# Patient Record
Sex: Female | Born: 1985 | Race: Black or African American | Hispanic: No | Marital: Married | Smoking: Current every day smoker
Health system: Southern US, Community
[De-identification: ages and names within clinical notes are randomized; demographics above are authoritative.]

## PROBLEM LIST (undated history)

## (undated) HISTORY — PX: ESOPHAGEAL DILATION: SHX303

## (undated) HISTORY — PX: OTHER SURGICAL HISTORY: SHX169

## (undated) HISTORY — PX: TONSILLECTOMY: SUR1361

## (undated) HISTORY — PX: CHOLECYSTECTOMY: SHX55

---

## 2004-06-30 ENCOUNTER — Emergency Department: Payer: Self-pay | Admitting: Emergency Medicine

## 2005-02-08 ENCOUNTER — Emergency Department: Payer: Self-pay | Admitting: Emergency Medicine

## 2006-01-20 ENCOUNTER — Emergency Department: Payer: Self-pay | Admitting: Unknown Physician Specialty

## 2006-02-15 ENCOUNTER — Emergency Department: Payer: Self-pay

## 2006-02-15 ENCOUNTER — Other Ambulatory Visit: Payer: Self-pay

## 2006-02-19 ENCOUNTER — Emergency Department: Payer: Self-pay | Admitting: Emergency Medicine

## 2006-03-10 ENCOUNTER — Other Ambulatory Visit: Payer: Self-pay

## 2006-03-10 ENCOUNTER — Emergency Department: Payer: Self-pay | Admitting: Emergency Medicine

## 2006-03-23 ENCOUNTER — Emergency Department: Payer: Self-pay | Admitting: Emergency Medicine

## 2006-07-30 ENCOUNTER — Emergency Department: Payer: Self-pay | Admitting: Emergency Medicine

## 2006-08-03 ENCOUNTER — Emergency Department: Payer: Self-pay | Admitting: Unknown Physician Specialty

## 2006-09-02 ENCOUNTER — Ambulatory Visit: Payer: Self-pay

## 2006-09-20 ENCOUNTER — Emergency Department: Payer: Self-pay | Admitting: Emergency Medicine

## 2006-10-03 ENCOUNTER — Emergency Department: Payer: Self-pay | Admitting: Emergency Medicine

## 2007-05-04 ENCOUNTER — Ambulatory Visit: Payer: Self-pay

## 2007-05-05 ENCOUNTER — Inpatient Hospital Stay: Payer: Self-pay

## 2007-11-08 ENCOUNTER — Emergency Department: Payer: Self-pay | Admitting: Emergency Medicine

## 2007-11-09 ENCOUNTER — Emergency Department: Payer: Self-pay | Admitting: Emergency Medicine

## 2009-01-08 IMAGING — CR NECK SOFT TISSUES - 1+ VIEW
1 series · 3 of 3 positions shown · non-contrast
Comparison: none

REASON FOR EXAM: POSSIBLE FISHBONE CAUGHT IN THROAT
COMMENTS:

PROCEDURE:     DXR - DXR SOFT TISSUE NECK  - November 08, 2007 [DATE]
RESULT:     Three views of the cervical airway, performed at soft tissue
technique, reveal no evidence of retained foreign body. The air column
appears normal and the prevertebral soft tissue spaces also appear normal.

[Series 1: view not recorded · 0.17mm/px · 3 of 3 slices shown]
[im 1/3]
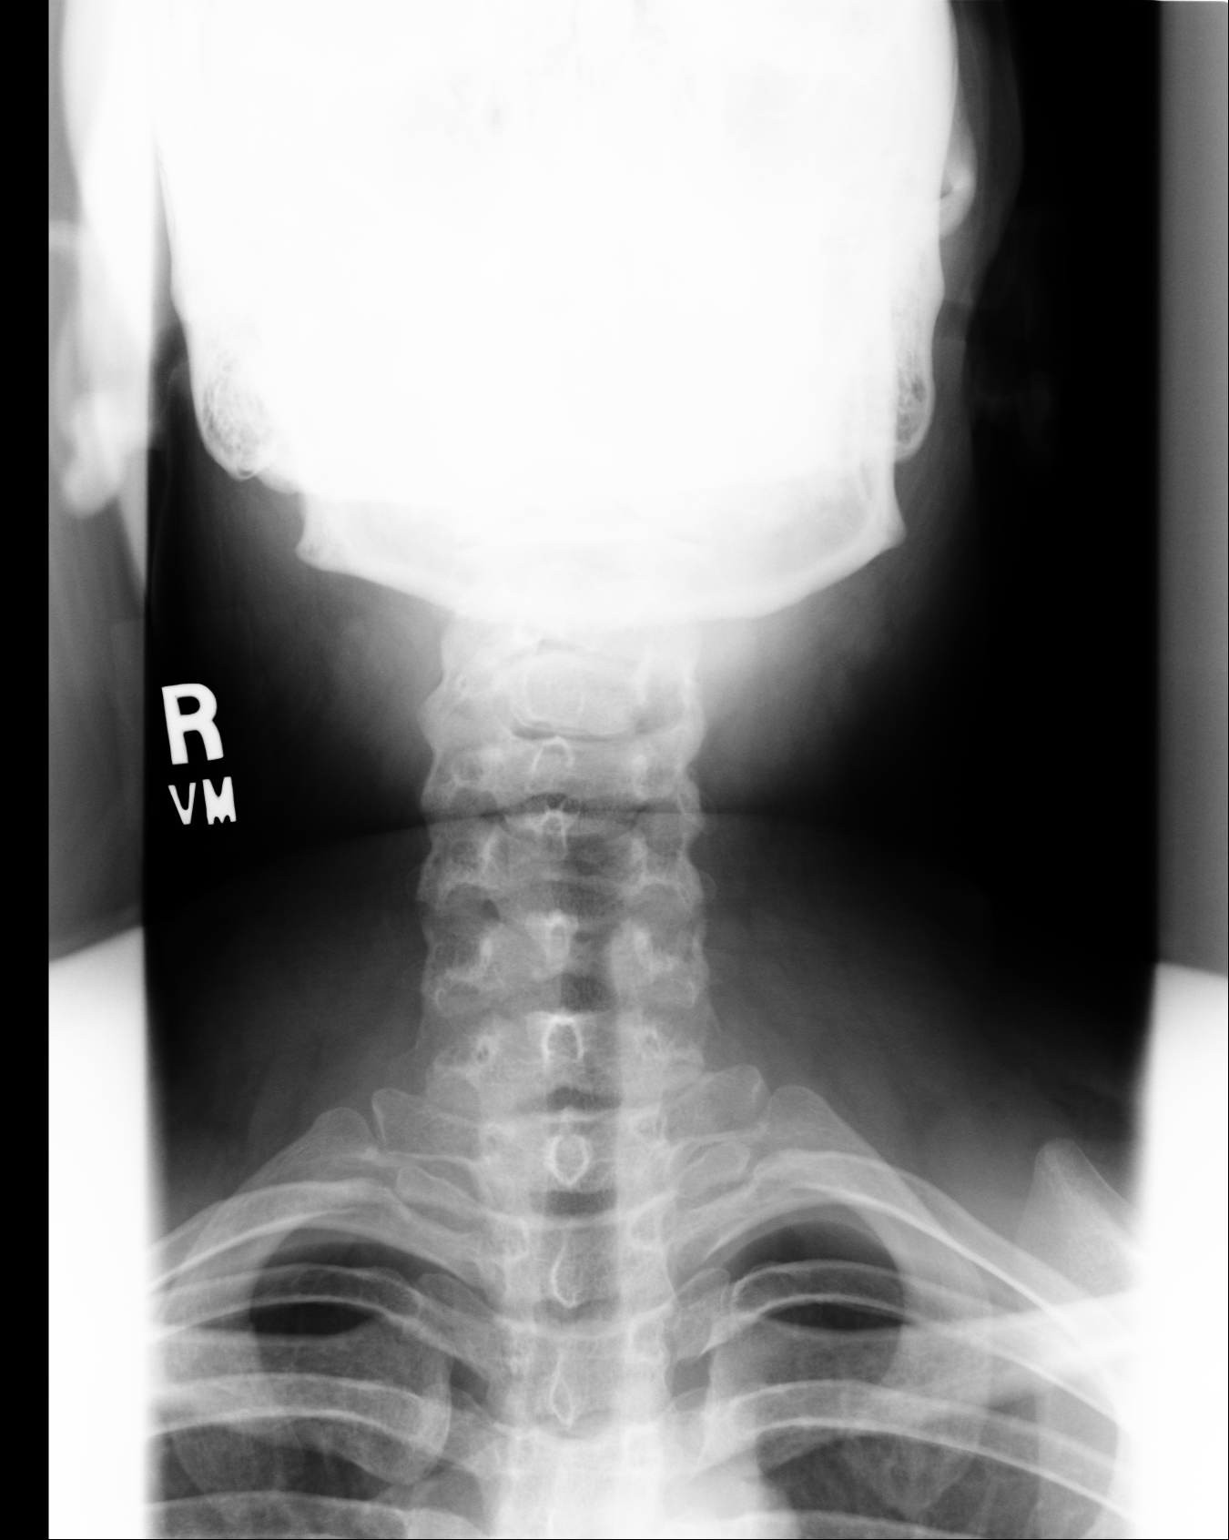
[im 2/3]
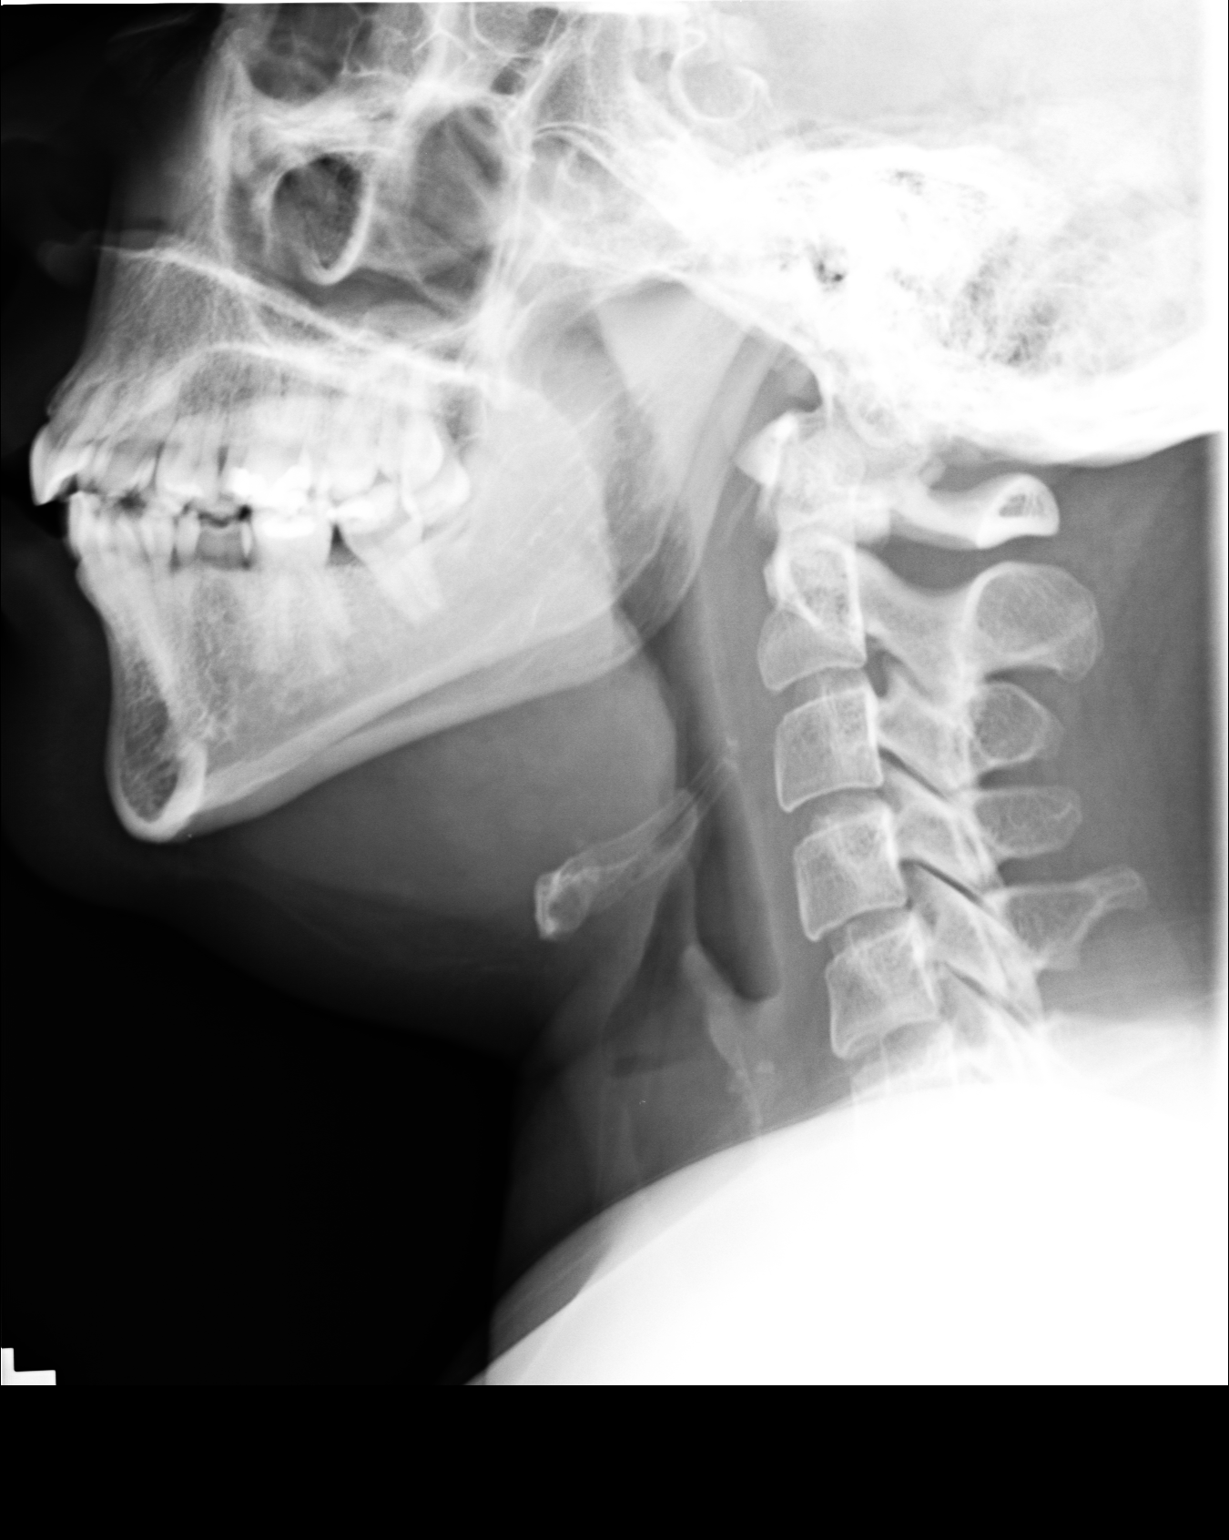
[im 3/3]
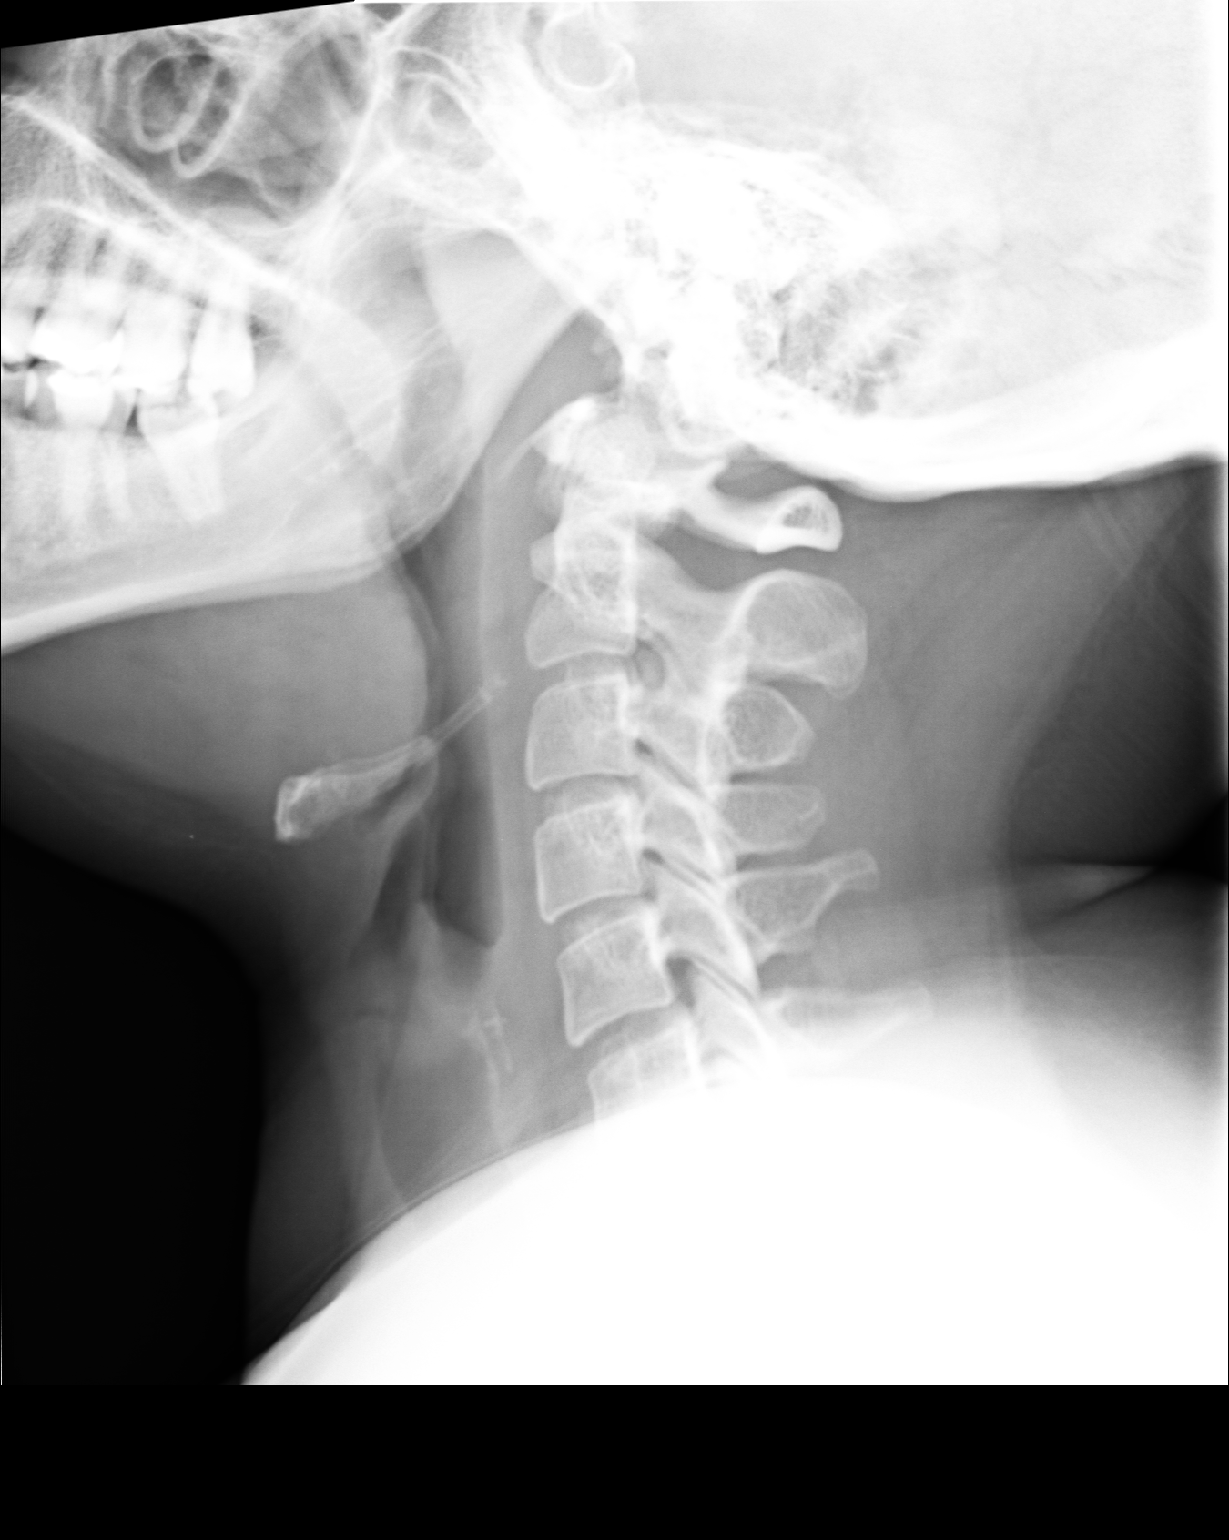

[3 of 3 positions shown; findings below may reference images not displayed]

IMPRESSION: I see no evidence of retained foreign body within the soft
tissues of the cervical airway.

## 2009-06-17 ENCOUNTER — Emergency Department: Payer: Self-pay

## 2011-12-22 ENCOUNTER — Emergency Department: Payer: Self-pay | Admitting: Emergency Medicine

## 2011-12-23 LAB — URINALYSIS, COMPLETE
Bilirubin,UR: NEGATIVE
Blood: NEGATIVE
Glucose,UR: NEGATIVE mg/dL (ref 0–75)
Leukocyte Esterase: NEGATIVE
Ph: 6 (ref 4.5–8.0)
Specific Gravity: 1.031 (ref 1.003–1.030)
WBC UR: 2 /HPF (ref 0–5)

## 2011-12-23 LAB — PREGNANCY, URINE: Pregnancy Test, Urine: NEGATIVE m[IU]/mL

## 2014-02-05 ENCOUNTER — Emergency Department: Payer: Self-pay | Admitting: Emergency Medicine

## 2014-02-05 LAB — BASIC METABOLIC PANEL
Anion Gap: 8 (ref 7–16)
BUN: 10 mg/dL (ref 7–18)
CO2: 25 mmol/L (ref 21–32)
Calcium, Total: 8.8 mg/dL (ref 8.5–10.1)
Chloride: 107 mmol/L (ref 98–107)
Creatinine: 0.96 mg/dL (ref 0.60–1.30)
EGFR (African American): >60
EGFR (Non-African Amer.): >60
GLUCOSE: 105 mg/dL — AB (ref 65–99)
Osmolality: 279 (ref 275–301)
Potassium: 3.1 mmol/L — ABNORMAL LOW (ref 3.5–5.1)
SODIUM: 140 mmol/L (ref 136–145)

## 2014-02-05 LAB — CBC
HCT: 38.7 % (ref 35.0–47.0)
HGB: 12 g/dL (ref 12.0–16.0)
MCH: 26.8 pg (ref 26.0–34.0)
MCHC: 31 g/dL — ABNORMAL LOW (ref 32.0–36.0)
MCV: 86 fL (ref 80–100)
Platelet: 385 10*3/uL (ref 150–440)
RBC: 4.48 10*6/uL (ref 3.80–5.20)
RDW: 14.5 % (ref 11.5–14.5)
WBC: 8.8 10*3/uL (ref 3.6–11.0)

## 2015-04-08 IMAGING — CT CT HEAD WITHOUT CONTRAST
1 series · 16 of 30 positions shown, 20 images · non-contrast
Comparison: None.

CLINICAL DATA: pt to ed with c/o right fifth digit numbness and
right foot numbness that started about 45 min pta, states right foot
feels better now however right hand fifth digit numbness remains.

EXAM:
CT HEAD WITHOUT CONTRAST
TECHNIQUE: Contiguous axial images were obtained from the base of the skull
through the vertex without intravenous contrast.

[Series 2: soft tissue · axial · 0.41mm/px · z∈[+1322,+1456]mm · 16 of 30 slices shown, 20 images]
[im 2/30  brain]
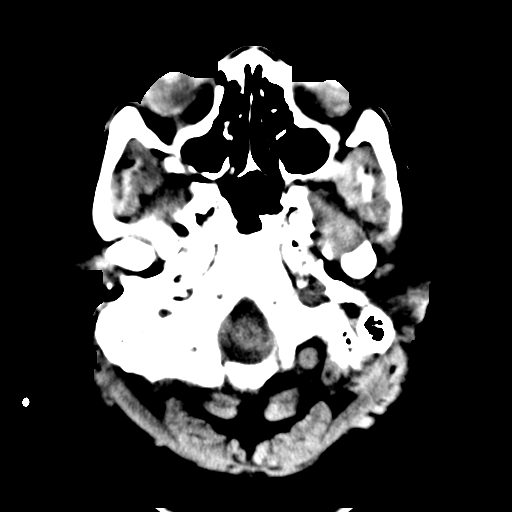
[im 2/30  bone]
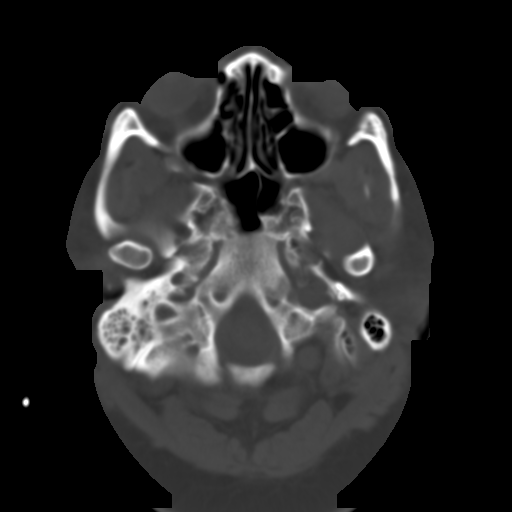
[im 4/30  brain]
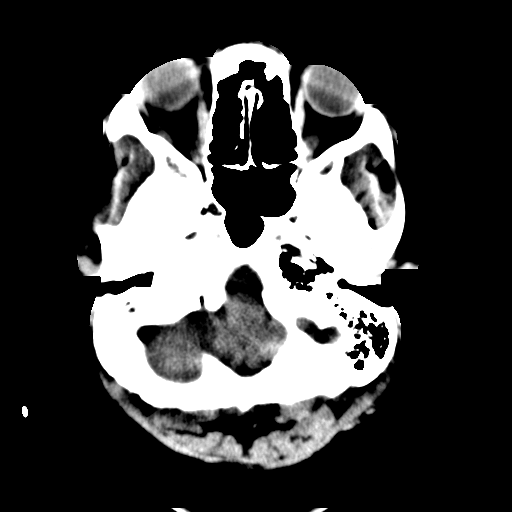
[im 6/30  brain]
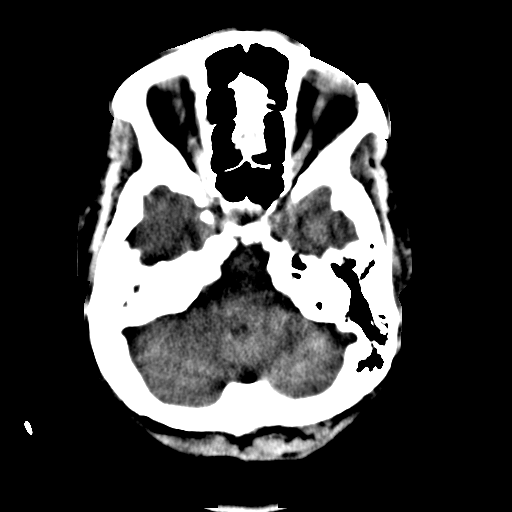
[im 8/30  brain]
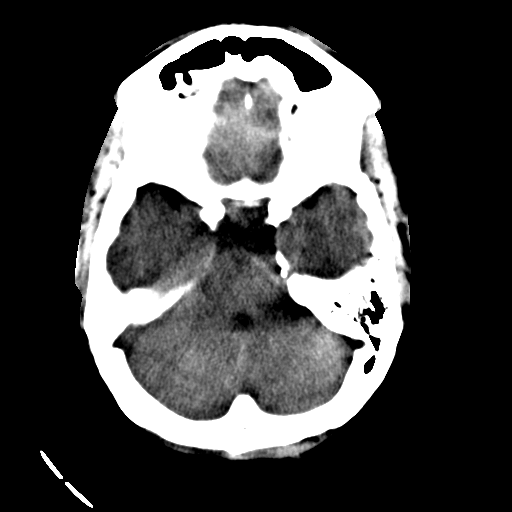
[im 9/30  brain]
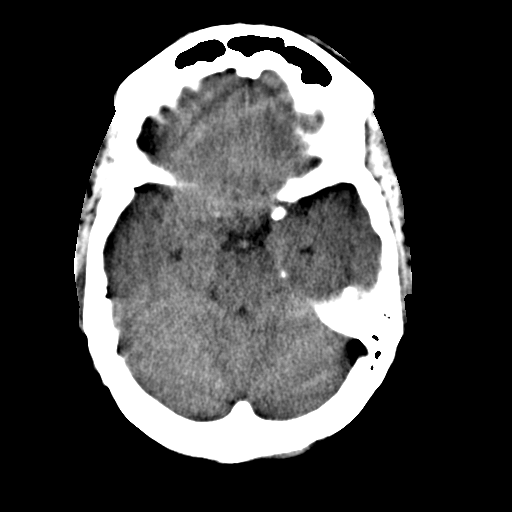
[im 9/30  bone]
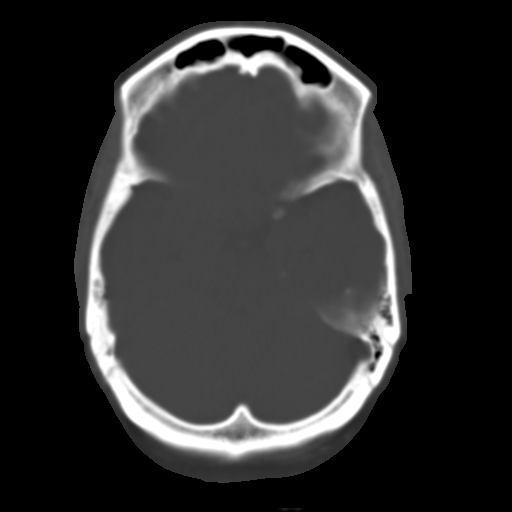
[im 11/30  brain]
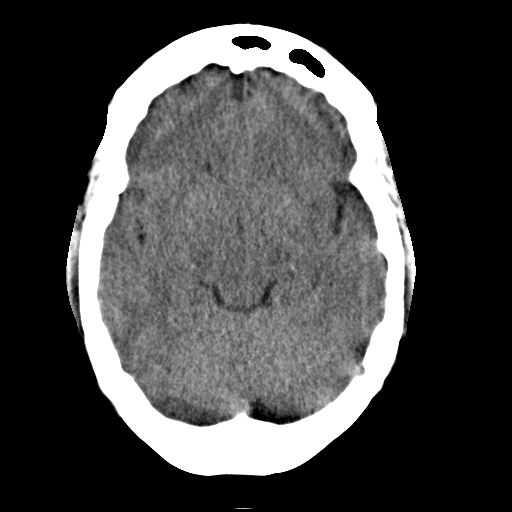
[im 13/30  brain]
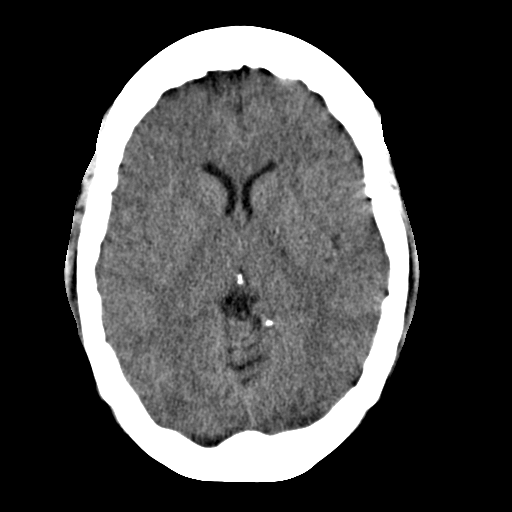
[im 15/30  brain]
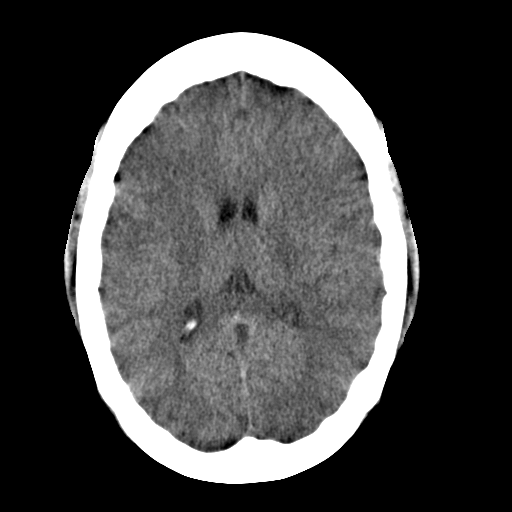
[im 16/30  brain]
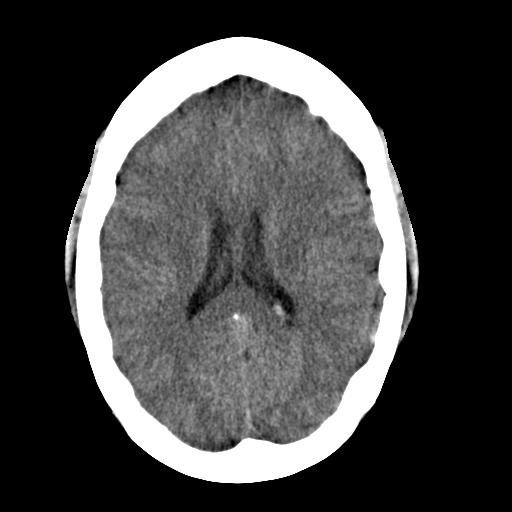
[im 16/30  bone]
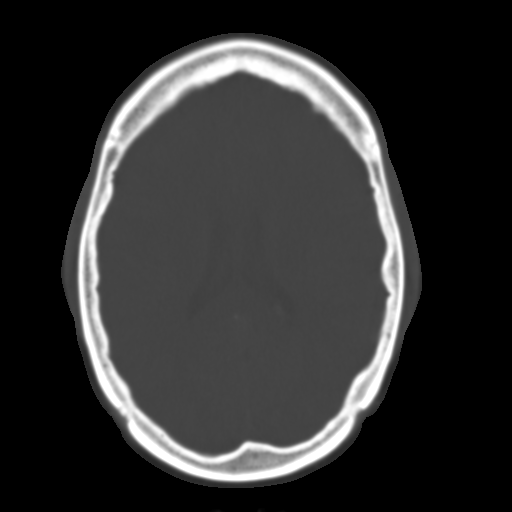
[im 18/30  brain]
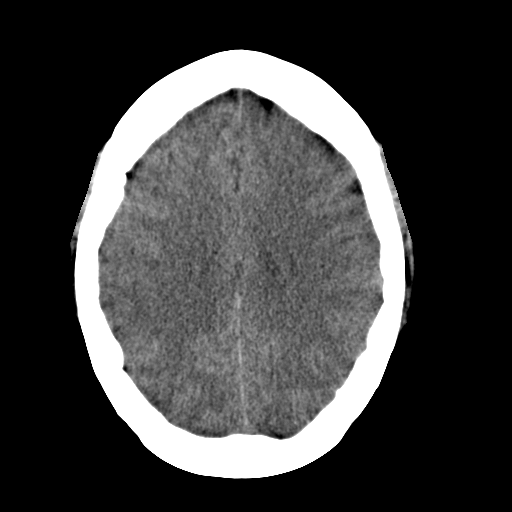
[im 20/30  brain]
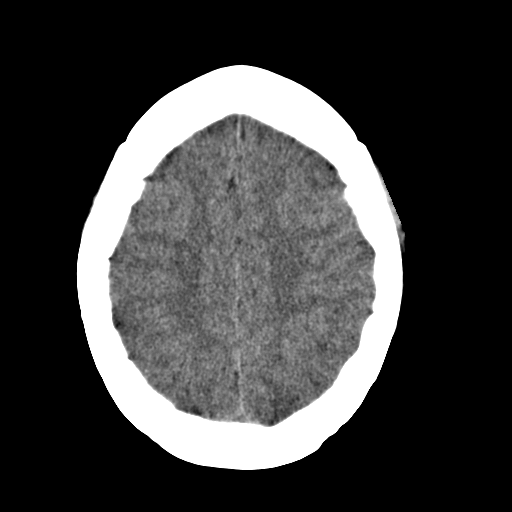
[im 22/30  brain]
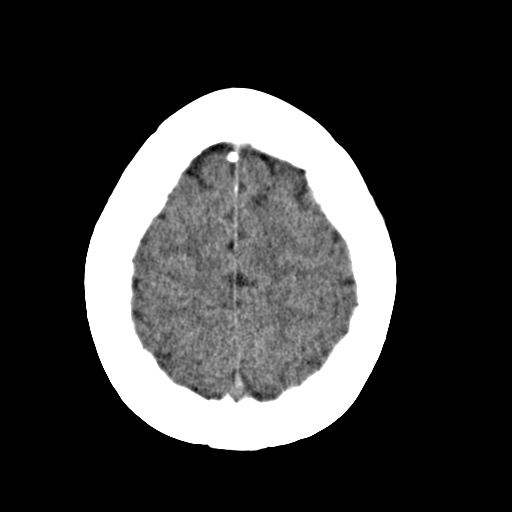
[im 23/30  brain]
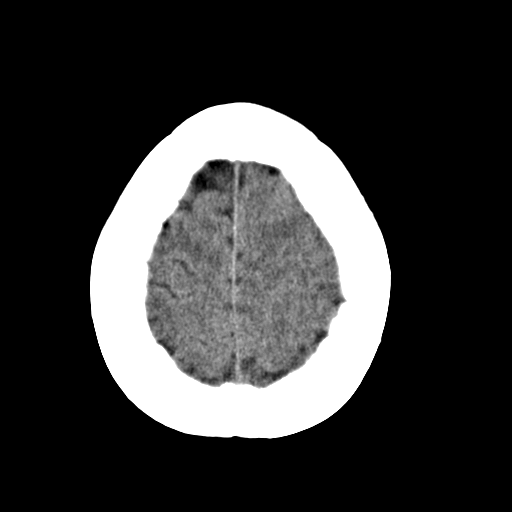
[im 23/30  bone]
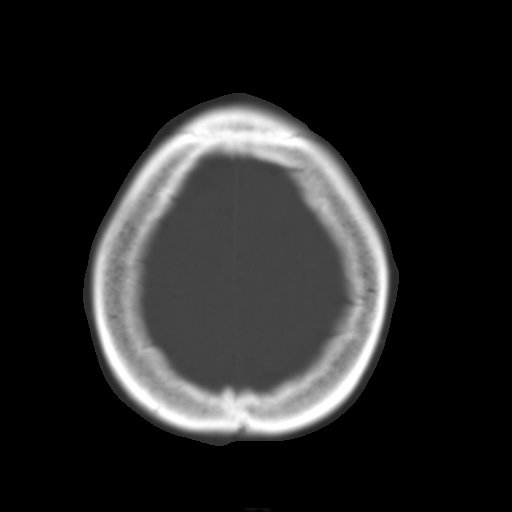
[im 25/30  brain]
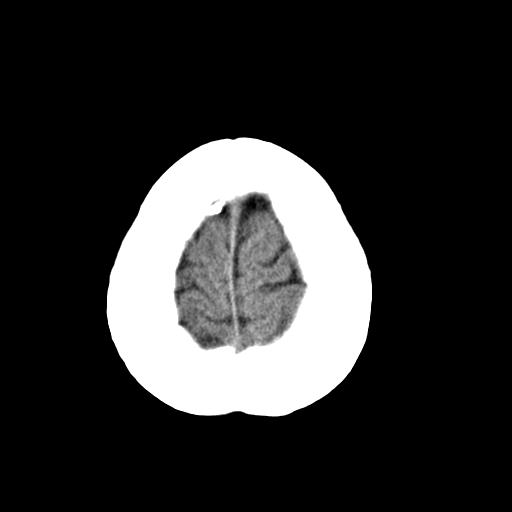
[im 27/30  brain]
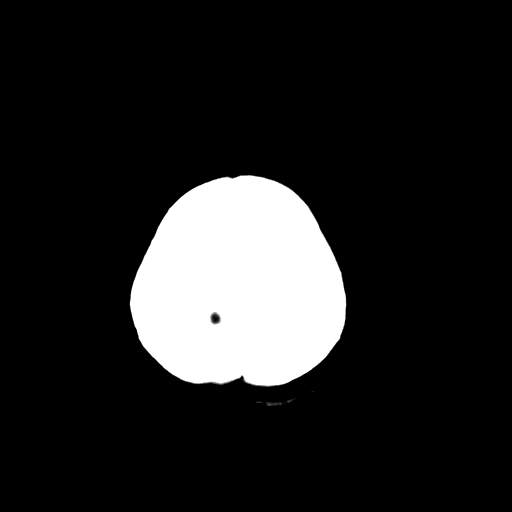
[im 29/30  brain]
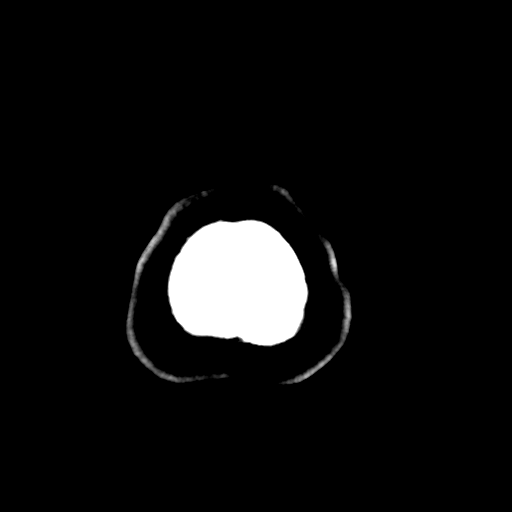

[16 of 30 positions shown; findings below may reference images not displayed]

FINDINGS: Opacification of right mastoid air cells. There is no evidence of
acute intracranial hemorrhage, brain edema, mass lesion, acute
infarction, mass effect, or midline shift. Acute infarct may be
inapparent on noncontrast CT. No other intra-axial abnormalities are
seen, and the ventricles and sulci are within normal limits in size
and symmetry. No abnormal extra-axial fluid collections or masses
are identified. No significant calvarial abnormality.
IMPRESSION: 1. Negative for bleed or other acute intracranial process.
2. Opacification of right mastoid air cells.

## 2018-06-12 ENCOUNTER — Emergency Department
Admission: EM | Admit: 2018-06-12 | Discharge: 2018-06-12 | Disposition: A | Discharge status: Home / Self Care | Payer: 59 | Attending: Student in an Organized Health Care Education/Training Program | Admitting: Student in an Organized Health Care Education/Training Program

## 2018-06-12 ENCOUNTER — Other Ambulatory Visit: Payer: Self-pay

## 2018-06-12 DIAGNOSIS — K2289 Other specified disease of esophagus: Secondary | ICD-10-CM

## 2018-06-12 DIAGNOSIS — F17200 Nicotine dependence, unspecified, uncomplicated: Secondary | ICD-10-CM | POA: Insufficient documentation

## 2018-06-12 DIAGNOSIS — K228 Other specified diseases of esophagus: Secondary | ICD-10-CM | POA: Insufficient documentation

## 2018-06-12 NOTE — ED Notes (Signed)
Pt had esopogeal dilation 1 week ago, c/o feeling like food is stuck in throat. Pt has been able to drink. Denies vomiting or SOB.

## 2018-06-12 NOTE — ED Triage Notes (Addendum)
A&O, ambulatory. Speaking in complete sentences.   States dilation of esophagus last week. Ate this AM and feels like food is stuck. Pt drinking coke in triage. No vomiting/spitting up noticed.

## 2018-06-12 NOTE — ED Provider Notes (Signed)
[facility] Emergency Department Provider Note    First MD Initiated Contact with Patient 06/12/18 0710     (approximate)  I have reviewed the triage vital signs and the nursing notes.   HISTORY  Chief Complaint Trouble swallowing   HPI Courtney Stark is a 32 y.o. female 1 week s/p esophageal dilatation p/w   discomfort after eating ham this morning.  States whenever she eats solid food or particularly meats over the past week she has had discomfort in her throat feeling like he gets stuck.  States that she drinks some soda or water and it passes.  Denied any diaphoresis or clamminess.  Is currently pain-free.  She has been on 40 of Protonix twice daily but recently did decrease that down to once daily.  Endoscopy was [place] in [place] and she is appear visiting family.   History reviewed. No pertinent past medical history. History reviewed. No pertinent family history. Past Surgical History:  Procedure Laterality Date  . adnoidectomy    . CESAREAN SECTION    . CHOLECYSTECTOMY    . ESOPHAGEAL DILATION    . TONSILLECTOMY     There are no active problems to display for this patient.     Prior to Admission medications   Not on File    Allergies Amoxicillin; Doxycycline; and Sulfa antibiotics    Social History Social History   Tobacco Use  . Smoking status: Current Every Day Smoker  Substance Use Topics  . Alcohol use: Never    Frequency: Never  . Drug use: Not on file    Review of Systems Patient denies headaches, rhinorrhea, blurry vision, numbness, shortness of breath, chest pain, edema, cough, abdominal pain, nausea, vomiting, diarrhea, dysuria, fevers, rashes or hallucinations unless otherwise stated above in HPI. ____________________________________________   PHYSICAL EXAM:  VITAL SIGNS: Vitals:   06/12/18 0705  BP: 122/80  Pulse: 87  Resp: 16  Temp: 97.7 F (36.5 C)  SpO2: 97%    Constitutional: Alert  and oriented. Pleasant and in NAD  Eyes: Conjunctivae are normal.  Head: Atraumatic. Nose: No congestion/rhinnorhea. Mouth/Throat: Mucous membranes are moist.   Neck: No stridor. Painless ROM.  Cardiovascular: Normal rate, regular rhythm. Grossly normal heart sounds.  Good peripheral circulation. Respiratory: Normal respiratory effort.  No retractions. Lungs CTAB. Gastrointestinal: Soft and nontender. No distention. No abdominal bruits. No CVA tenderness. Genitourinary:  Musculoskeletal: No lower extremity tenderness nor edema.  No joint effusions. Neurologic:  Normal speech and language. No gross focal neurologic deficits are appreciated. No facial droop Skin:  Skin is warm, dry and intact. No rash noted. Psychiatric: Mood and affect are normal. Speech and behavior are normal.  ____________________________________________   LABS (all labs ordered are listed, but only abnormal results are displayed)  No results found for this or any previous visit (from the past 24 hour(s)). ____________________________________________ ____________________________________________  RADIOLOGY   ____________________________________________   PROCEDURES  Procedure(s) performed:  Procedures    Critical Care performed: no ____________________________________________   INITIAL IMPRESSION / ASSESSMENT AND PLAN / ED COURSE  Pertinent labs & imaging results that were available during my care of the patient were reviewed by me and considered in my medical decision making (see chart for details).   DDX: Food bolus impaction, esophageal spasm, esophagitis, gastritis  Courtney Stark is a 32 y.o. who presents to the ED with symptoms as described above.  She is currently symptomatic.  She is tolerating clear liquid.  No respiratory distress.  Not consistent with cardiac or pulmonary pathology.  Discussed conservative management and follow-up with GI.  Have discussed with the patient and available  family all diagnostics and treatments performed thus far and all questions were answered to the best of my ability. The patient demonstrates understanding and agreement with plan.       As part of my medical decision making, I reviewed the following data within the electronic MEDICAL RECORD NUMBER Nursing notes reviewed and incorporated, Labs reviewed, notes from prior ED visits.  ____________________________________________   FINAL CLINICAL IMPRESSION(S) / ED DIAGNOSES  Final diagnoses:  Esophageal dilatation      NEW MEDICATIONS STARTED DURING THIS VISIT:  There are no discharge medications for this patient.    Note:  This document was prepared using Dragon voice recognition software and may include unintentional dictation errors.    Willy Eddy, MD 06/12/18 [phone]

## 2018-06-12 NOTE — Discharge Instructions (Addendum)
Please increase your Protonix dosing to twice daily until Sunday.  Be sure to drink plenty of fluids and have a very soft diet this would include things like applesauce consistency type foods.  Return immediately if you have any worsening pain particularly if you can't tolerate liquids.
